# Patient Record
Sex: Female | Born: 2000
Health system: Southern US, Community
[De-identification: ages and names within clinical notes are randomized; demographics above are authoritative.]

## PROBLEM LIST (undated history)

## (undated) DIAGNOSIS — J45909 Unspecified asthma, uncomplicated: Secondary | ICD-10-CM

## (undated) DIAGNOSIS — J302 Other seasonal allergic rhinitis: Secondary | ICD-10-CM

---
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
## 2018-01-24 DIAGNOSIS — L810 Postinflammatory hyperpigmentation: Secondary | ICD-10-CM | POA: Diagnosis not present

## 2018-01-24 DIAGNOSIS — L309 Dermatitis, unspecified: Secondary | ICD-10-CM | POA: Diagnosis not present

## 2018-02-23 DIAGNOSIS — N946 Dysmenorrhea, unspecified: Secondary | ICD-10-CM | POA: Diagnosis not present

## 2018-03-21 ENCOUNTER — Emergency Department (HOSPITAL_COMMUNITY): Payer: BLUE CROSS/BLUE SHIELD

## 2018-03-21 ENCOUNTER — Other Ambulatory Visit: Payer: Self-pay

## 2018-03-21 ENCOUNTER — Encounter (HOSPITAL_COMMUNITY): Payer: Self-pay | Admitting: Emergency Medicine

## 2018-03-21 DIAGNOSIS — R0602 Shortness of breath: Secondary | ICD-10-CM | POA: Insufficient documentation

## 2018-03-21 DIAGNOSIS — Z79899 Other long term (current) drug therapy: Secondary | ICD-10-CM | POA: Diagnosis not present

## 2018-03-21 DIAGNOSIS — J45909 Unspecified asthma, uncomplicated: Secondary | ICD-10-CM | POA: Insufficient documentation

## 2018-03-21 DIAGNOSIS — R0789 Other chest pain: Secondary | ICD-10-CM | POA: Insufficient documentation

## 2018-03-21 NOTE — ED Triage Notes (Signed)
Pt presents with mother for complaint of shortness of breath that started around 5pm tonight and felt like chest is caving in. No acute distress noted at time of triage and no excessive work of breathing noted.

## 2018-03-22 ENCOUNTER — Emergency Department (HOSPITAL_COMMUNITY)
Admission: EM | Admit: 2018-03-22 | Discharge: 2018-03-22 | Disposition: A | Discharge status: Home / Self Care | Payer: BLUE CROSS/BLUE SHIELD | Attending: Emergency Medicine | Admitting: Emergency Medicine

## 2018-03-22 DIAGNOSIS — R0789 Other chest pain: Secondary | ICD-10-CM

## 2018-03-22 LAB — CBC WITH DIFFERENTIAL/PLATELET
Basophils Absolute: 0.1 10*3/uL (ref 0.0–0.1)
Basophils Relative: 1 %
Eosinophils Absolute: 0.3 10*3/uL (ref 0.0–0.7)
Eosinophils Relative: 5 %
HCT: 37.3 % (ref 36.0–46.0)
Hemoglobin: 12.3 g/dL (ref 12.0–15.0)
Lymphocytes Relative: 46 %
Lymphs Abs: 3 10*3/uL (ref 0.7–4.0)
MCH: 30.2 pg (ref 26.0–34.0)
MCHC: 33 g/dL (ref 30.0–36.0)
MCV: 91.6 fL (ref 78.0–100.0)
Monocytes Absolute: 0.7 10*3/uL (ref 0.1–1.0)
Monocytes Relative: 10 %
Neutro Abs: 2.5 10*3/uL (ref 1.7–7.7)
Neutrophils Relative %: 38 %
Platelets: 243 10*3/uL (ref 150–400)
RBC: 4.07 MIL/uL (ref 3.87–5.11)
RDW: 12.9 % (ref 11.5–15.5)
WBC: 6.6 10*3/uL (ref 4.0–10.5)

## 2018-03-22 LAB — BASIC METABOLIC PANEL
Anion gap: 6 (ref 5–15)
BUN: 13 mg/dL (ref 6–20)
CO2: 27 mmol/L (ref 22–32)
Calcium: 9.1 mg/dL (ref 8.9–10.3)
Chloride: 106 mmol/L (ref 101–111)
Creatinine, Ser: 0.68 mg/dL (ref 0.44–1.00)
GFR calc Af Amer: >60 mL/min (ref >60)
GFR calc non Af Amer: >60 mL/min (ref >60)
Glucose, Bld: 103 mg/dL — ABNORMAL HIGH (ref 65–99)
Potassium: 3 mmol/L — ABNORMAL LOW (ref 3.5–5.1)
Sodium: 139 mmol/L (ref 135–145)

## 2018-03-22 LAB — I-STAT BETA HCG BLOOD, ED (MC, WL, AP ONLY): I-stat hCG, quantitative: <5 m[IU]/mL (ref <5)

## 2018-03-22 LAB — D-DIMER, QUANTITATIVE: D-Dimer, Quant: <0.27 ug/mL-FEU (ref 0.00–0.50)

## 2018-03-22 MED ORDER — POTASSIUM CHLORIDE CRYS ER 20 MEQ PO TBCR: 20.0000 meq | EXTENDED_RELEASE_TABLET | Freq: Two times a day (BID) | ORAL | 0 refills | Status: AC

## 2018-03-22 MED ORDER — POTASSIUM CHLORIDE CRYS ER 20 MEQ PO TBCR
40.0000 meq | EXTENDED_RELEASE_TABLET | Freq: Once | ORAL | Status: AC
  Administered 2018-03-22: 40 meq via ORAL
  Filled 2018-03-22: qty 2

## 2018-03-22 MED ORDER — ALBUTEROL SULFATE (2.5 MG/3ML) 0.083% IN NEBU
2.5000 mg | INHALATION_SOLUTION | Freq: Once | RESPIRATORY_TRACT | Status: AC
  Administered 2018-03-22: 2.5 mg via RESPIRATORY_TRACT
  Filled 2018-03-22: qty 3

## 2018-03-22 MED ORDER — IBUPROFEN 200 MG PO TABS
400.0000 mg | ORAL_TABLET | Freq: Once | ORAL | Status: AC
  Administered 2018-03-22: 400 mg via ORAL
  Filled 2018-03-22: qty 2

## 2018-03-22 NOTE — ED Provider Notes (Signed)
[facility]-EMERGENCY DEPT Provider Note   CSN: [id number] Arrival date & time: 03/21/18  2204  Time seen 04:05 AM   History   Chief Complaint Chief Complaint  Patient presents with  . Shortness of Breath    HPI Leah Ortega is a 17 y.o. female.  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  She states today, April 1 she had laid down to take a nap and when she woke up at 5 PM she had some left upper chest pain and shortness of breath.  She describes the pain as pressure and tightness.  It hurts with movement.  Nothing makes it feel better.  She has had it before and when she used her inhaler it would get better.  She used her inhaler tonight and it helped for a little while but then it returns.  She states she was wheezing initially but not now.  She states she has had a mild cough in the ED without fever.  She had a sore throat last week but not now.  She denies rhinorrhea, sneezing, nausea, or vomiting.  She denies being on birth control pills or recent travel.  Family history there was a paternal great grandmother who had some type of clotting problem.  PCP Dr Shaune Pollackonna Gates  Past Medical History:  Diagnosis Date  . Asthma   . Seasonal allergies     There are no active problems to display for this patient.   History reviewed. No pertinent surgical history.   OB History   None      Home Medications    Prior to Admission medications   Medication Sig Start Date End Date Taking? Authorizing Provider  albuterol (PROVENTIL HFA;VENTOLIN HFA) 108 (90 BASE) MCG/ACT inhaler Inhale 1-2 puffs into the lungs every 6 (six) hours as needed for wheezing. 02/13/13  Yes Jimmie Mollyoll, Paolo, MD  levocetirizine (XYZAL) 5 MG tablet Take 5 mg by mouth every evening.   Yes [provider]  amoxicillin (AMOXIL) 500 MG tablet Take 1 tablet (500 mg total) by mouth 2 (two) times daily. Patient not  taking: Reported on 03/22/2018 04/24/13   McGill, Marga HootsJacquelyn A, MD  fluticasone (FLONASE) 50 MCG/ACT nasal spray Place 2 sprays into the nose daily. Patient not taking: Reported on 03/22/2018 04/24/13   McGill, Marga HootsJacquelyn A, MD  potassium chloride SA (K-DUR,KLOR-CON) 20 MEQ tablet Take 1 tablet (20 mEq total) by mouth 2 (two) times daily. 03/22/18   Devoria AlbeKnapp, Shahidah Nesbitt, MD    Family History Family History  Problem Relation Age of Onset  . Hyperlipidemia Mother   . Asthma Mother   . Diabetes Other   . Hypertension Other     Social History Social History   Tobacco Use  . Smoking status: Never Smoker  . Smokeless tobacco: Never Used  Substance Use Topics  . Alcohol use: No  . Drug use: No     Allergies   Patient has no known allergies.   Review of Systems Review of Systems  All other systems reviewed and are negative.    Physical Exam Updated Vital Signs BP 106/71 (BP Location: Left Arm)   Pulse (!) 51   Temp 98.4 F (36.9 C) (Oral)   Resp 17   Ht 5\' 2"  (1.575 m)   Wt 54.4 kg (120 lb)   LMP 03/14/2018   SpO2 99%   BMI 21.95 kg/m  Vital signs normal except for bradycardia   Physical Exam  Constitutional: She is oriented to person, place, and time. She appears well-developed and well-nourished.  Non-toxic appearance. She does not appear ill. No distress.  HENT:  Head: Normocephalic and atraumatic.  Right Ear: External ear normal.  Left Ear: External ear normal.  Nose: Nose normal. No mucosal edema or rhinorrhea.  Mouth/Throat: Oropharynx is clear and moist and mucous membranes are normal. No dental abscesses or uvula swelling.  Eyes: Pupils are equal, round, and reactive to light. Conjunctivae and EOM are normal.  Neck: Normal range of motion and full passive range of motion without pain. Neck supple.  Cardiovascular: Normal rate, regular rhythm and normal heart sounds. Exam reveals no gallop and no friction rub.  No murmur heard. Pulmonary/Chest: Effort normal and breath  sounds normal. No respiratory distress. She has no wheezes. She has no rhonchi. She has no rales. She exhibits no tenderness and no crepitus.  Area of chest pain noted, she was nontender to palpation.    Abdominal: Soft. Normal appearance and bowel sounds are normal. She exhibits no distension. There is no tenderness. There is no rebound and no guarding.  Musculoskeletal: Normal range of motion. She exhibits no edema or tenderness.  Moves all extremities well.   Neurological: She is alert and oriented to person, place, and time. She has normal strength. No cranial nerve deficit.  Skin: Skin is warm, dry and intact. No rash noted. No erythema. No pallor.  Psychiatric: She has a normal mood and affect. Her speech is normal and behavior is normal. Her mood appears not anxious.  Patient seem to have a lot of fluttering of her eyelids.  Nursing note and vitals reviewed.    ED Treatments / Results  Labs (all labs ordered are listed, but only abnormal results are displayed)  Results for orders placed or performed during the hospital encounter of 03/22/18  D-dimer, quantitative  Result Value Ref Range   D-Dimer, Quant <0.27 0.00 - 0.50 ug/mL-FEU  Basic metabolic panel  Result Value Ref Range   Sodium 139 135 - 145 mmol/L   Potassium 3.0 (L) 3.5 - 5.1 mmol/L   Chloride 106 101 - 111 mmol/L   CO2 27 22 - 32 mmol/L   Glucose, Bld 103 (H) 65 - 99 mg/dL   BUN 13 6 - 20 mg/dL   Creatinine, Ser 1.61 0.44 - 1.00 mg/dL   Calcium 9.1 8.9 - 09.6 mg/dL   GFR calc non Af Amer >60 >60 mL/min   GFR calc Af Amer >60 >60 mL/min   Anion gap 6 5 - 15  CBC with Differential  Result Value Ref Range   WBC 6.6 4.0 - 10.5 K/uL   RBC 4.07 3.87 - 5.11 MIL/uL   Hemoglobin 12.3 12.0 - 15.0 g/dL   HCT 04.5 40.9 - 81.1 %   MCV 91.6 78.0 - 100.0 fL   MCH 30.2 26.0 - 34.0 pg   MCHC 33.0 30.0 - 36.0 g/dL   RDW 91.4 78.2 - 95.6 %   Platelets 243 150 - 400 K/uL   Neutrophils Relative % 38 %   Neutro Abs 2.5 1.7  - 7.7 K/uL   Lymphocytes Relative 46 %   Lymphs Abs 3.0 0.7 - 4.0 K/uL   Monocytes Relative 10 %   Monocytes Absolute 0.7 0.1 - 1.0 K/uL   Eosinophils Relative 5 %   Eosinophils Absolute 0.3 0.0 - 0.7 K/uL   Basophils Relative 1 %  Basophils Absolute 0.1 0.0 - 0.1 K/uL  I-Stat beta hCG blood, ED  Result Value Ref Range   I-stat hCG, quantitative <5.0 <5 mIU/mL   Comment 3           Laboratory interpretation all normal except hypokalemia   EKG None  Radiology Dg Chest 2 View  Result Date: 03/21/2018 CLINICAL DATA:  Shortness of breath EXAM: CHEST - 2 VIEW COMPARISON:  None. FINDINGS: The heart size and mediastinal contours are within normal limits. Both lungs are clear. The visualized skeletal structures are unremarkable. IMPRESSION: No active cardiopulmonary disease. Electronically Signed   By: Jasmine Pang M.D.   On: 03/21/2018 23:18    Procedures Procedures (including critical care time)  Medications Ordered in ED Medications  ibuprofen (ADVIL,MOTRIN) tablet 400 mg (400 mg Oral Given 03/22/18 0440)  albuterol (PROVENTIL) (2.5 MG/3ML) 0.083% nebulizer solution 2.5 mg (2.5 mg Nebulization Given 03/22/18 0504)  potassium chloride SA (K-DUR,KLOR-CON) CR tablet 40 mEq (40 mEq Oral Given 03/22/18 0543)     Initial Impression / Assessment and Plan / ED Course  I have reviewed the triage vital signs and the nursing notes.  Pertinent labs & imaging results that were available during my care of the patient were reviewed by me and considered in my medical decision making (see chart for details).    Patient was given ibuprofen for her chest pain.  D-dimer was done to do a screen for blood clotting problems.  Patient was not wheezing and did not want a albuterol nebulizer at this time however she was advised to let nursing staff know she needed one.  4:50 AM nursing staff states patient is requesting a nebulizer treatment, she was given albuterol.  Recheck at time of discharge patient  is laughing with her mother.  She states her pain is much better.  We discussed her test results which were normal except for the low potassium.  Patient was given a dose in the ED and a prescription to take home.  At this time I think her pain is probably musculoskeletal most likely from the stress of her week in college last week where she had a lot of exams and projects she had to do.   Final Clinical Impressions(s) / ED Diagnoses   Final diagnoses:  Atypical chest pain    ED Discharge Orders        Ordered    potassium chloride SA (K-DUR,KLOR-CON) 20 MEQ tablet  2 times daily     03/22/18 0543    OTC ibuprofen   Plan discharge  Devoria Albe, MD, Concha Pyo, MD 03/22/18 [id number]

## 2018-03-22 NOTE — Discharge Instructions (Addendum)
Use ice and heat for comfort. Take ibuprofen 400 mg 4 times a day for pain. Use your inhaler as needed for wheezing or shortness of breath.  Recheck if you get a fever or struggle to breathe.

## 2018-05-11 DIAGNOSIS — R509 Fever, unspecified: Secondary | ICD-10-CM | POA: Diagnosis not present

## 2018-05-11 DIAGNOSIS — J029 Acute pharyngitis, unspecified: Secondary | ICD-10-CM | POA: Diagnosis not present

## 2018-05-11 DIAGNOSIS — B349 Viral infection, unspecified: Secondary | ICD-10-CM | POA: Diagnosis not present

## 2018-05-14 DIAGNOSIS — J039 Acute tonsillitis, unspecified: Secondary | ICD-10-CM | POA: Diagnosis not present

## 2018-06-11 DIAGNOSIS — J0100 Acute maxillary sinusitis, unspecified: Secondary | ICD-10-CM | POA: Diagnosis not present

## 2018-12-05 DIAGNOSIS — Z30013 Encounter for initial prescription of injectable contraceptive: Secondary | ICD-10-CM | POA: Diagnosis not present

## 2018-12-05 DIAGNOSIS — N946 Dysmenorrhea, unspecified: Secondary | ICD-10-CM | POA: Diagnosis not present

## 2019-02-22 DIAGNOSIS — Z3042 Encounter for surveillance of injectable contraceptive: Secondary | ICD-10-CM | POA: Diagnosis not present

## 2019-05-04 DIAGNOSIS — M25562 Pain in left knee: Secondary | ICD-10-CM | POA: Diagnosis not present

## 2019-05-04 DIAGNOSIS — M25362 Other instability, left knee: Secondary | ICD-10-CM | POA: Diagnosis not present

## 2019-05-11 DIAGNOSIS — M25562 Pain in left knee: Secondary | ICD-10-CM | POA: Diagnosis not present

## 2019-05-11 DIAGNOSIS — M25662 Stiffness of left knee, not elsewhere classified: Secondary | ICD-10-CM | POA: Diagnosis not present

## 2019-05-11 DIAGNOSIS — M25362 Other instability, left knee: Secondary | ICD-10-CM | POA: Diagnosis not present

## 2019-05-11 DIAGNOSIS — R2689 Other abnormalities of gait and mobility: Secondary | ICD-10-CM | POA: Diagnosis not present

## 2019-05-11 DIAGNOSIS — Z3042 Encounter for surveillance of injectable contraceptive: Secondary | ICD-10-CM | POA: Diagnosis not present

## 2019-05-18 DIAGNOSIS — M25562 Pain in left knee: Secondary | ICD-10-CM | POA: Diagnosis not present

## 2019-05-18 DIAGNOSIS — M25662 Stiffness of left knee, not elsewhere classified: Secondary | ICD-10-CM | POA: Diagnosis not present

## 2019-05-18 DIAGNOSIS — R2689 Other abnormalities of gait and mobility: Secondary | ICD-10-CM | POA: Diagnosis not present

## 2019-05-18 DIAGNOSIS — M25362 Other instability, left knee: Secondary | ICD-10-CM | POA: Diagnosis not present

## 2019-05-24 DIAGNOSIS — M25512 Pain in left shoulder: Secondary | ICD-10-CM | POA: Diagnosis not present

## 2019-05-24 DIAGNOSIS — M25511 Pain in right shoulder: Secondary | ICD-10-CM | POA: Diagnosis not present

## 2019-05-24 DIAGNOSIS — M5020 Other cervical disc displacement, unspecified cervical region: Secondary | ICD-10-CM | POA: Diagnosis not present

## 2019-05-24 DIAGNOSIS — M6281 Muscle weakness (generalized): Secondary | ICD-10-CM | POA: Diagnosis not present

## 2019-05-31 DIAGNOSIS — M25511 Pain in right shoulder: Secondary | ICD-10-CM | POA: Diagnosis not present

## 2019-05-31 DIAGNOSIS — M6281 Muscle weakness (generalized): Secondary | ICD-10-CM | POA: Diagnosis not present

## 2019-05-31 DIAGNOSIS — M25512 Pain in left shoulder: Secondary | ICD-10-CM | POA: Diagnosis not present

## 2019-05-31 DIAGNOSIS — M5020 Other cervical disc displacement, unspecified cervical region: Secondary | ICD-10-CM | POA: Diagnosis not present

## 2019-06-07 DIAGNOSIS — M25511 Pain in right shoulder: Secondary | ICD-10-CM | POA: Diagnosis not present

## 2019-06-07 DIAGNOSIS — M6281 Muscle weakness (generalized): Secondary | ICD-10-CM | POA: Diagnosis not present

## 2019-06-07 DIAGNOSIS — M25512 Pain in left shoulder: Secondary | ICD-10-CM | POA: Diagnosis not present

## 2019-06-07 DIAGNOSIS — M5020 Other cervical disc displacement, unspecified cervical region: Secondary | ICD-10-CM | POA: Diagnosis not present

## 2019-06-14 DIAGNOSIS — M5020 Other cervical disc displacement, unspecified cervical region: Secondary | ICD-10-CM | POA: Diagnosis not present

## 2019-06-14 DIAGNOSIS — M25511 Pain in right shoulder: Secondary | ICD-10-CM | POA: Diagnosis not present

## 2019-06-14 DIAGNOSIS — M25512 Pain in left shoulder: Secondary | ICD-10-CM | POA: Diagnosis not present

## 2019-06-14 DIAGNOSIS — M6281 Muscle weakness (generalized): Secondary | ICD-10-CM | POA: Diagnosis not present

## 2019-06-21 DIAGNOSIS — M6281 Muscle weakness (generalized): Secondary | ICD-10-CM | POA: Diagnosis not present

## 2019-06-21 DIAGNOSIS — R2689 Other abnormalities of gait and mobility: Secondary | ICD-10-CM | POA: Diagnosis not present

## 2019-06-21 DIAGNOSIS — R6889 Other general symptoms and signs: Secondary | ICD-10-CM | POA: Diagnosis not present

## 2019-06-21 DIAGNOSIS — M222X2 Patellofemoral disorders, left knee: Secondary | ICD-10-CM | POA: Diagnosis not present

## 2019-07-27 DIAGNOSIS — Z3042 Encounter for surveillance of injectable contraceptive: Secondary | ICD-10-CM | POA: Diagnosis not present

## 2019-10-12 DIAGNOSIS — Z3042 Encounter for surveillance of injectable contraceptive: Secondary | ICD-10-CM | POA: Diagnosis not present

## 2019-11-17 IMAGING — CR DG CHEST 2V
2 series · 2 of 2 positions shown · non-contrast
Comparison: None.

CLINICAL DATA: Shortness of breath

EXAM:
CHEST - 2 VIEW

[w chest pa]
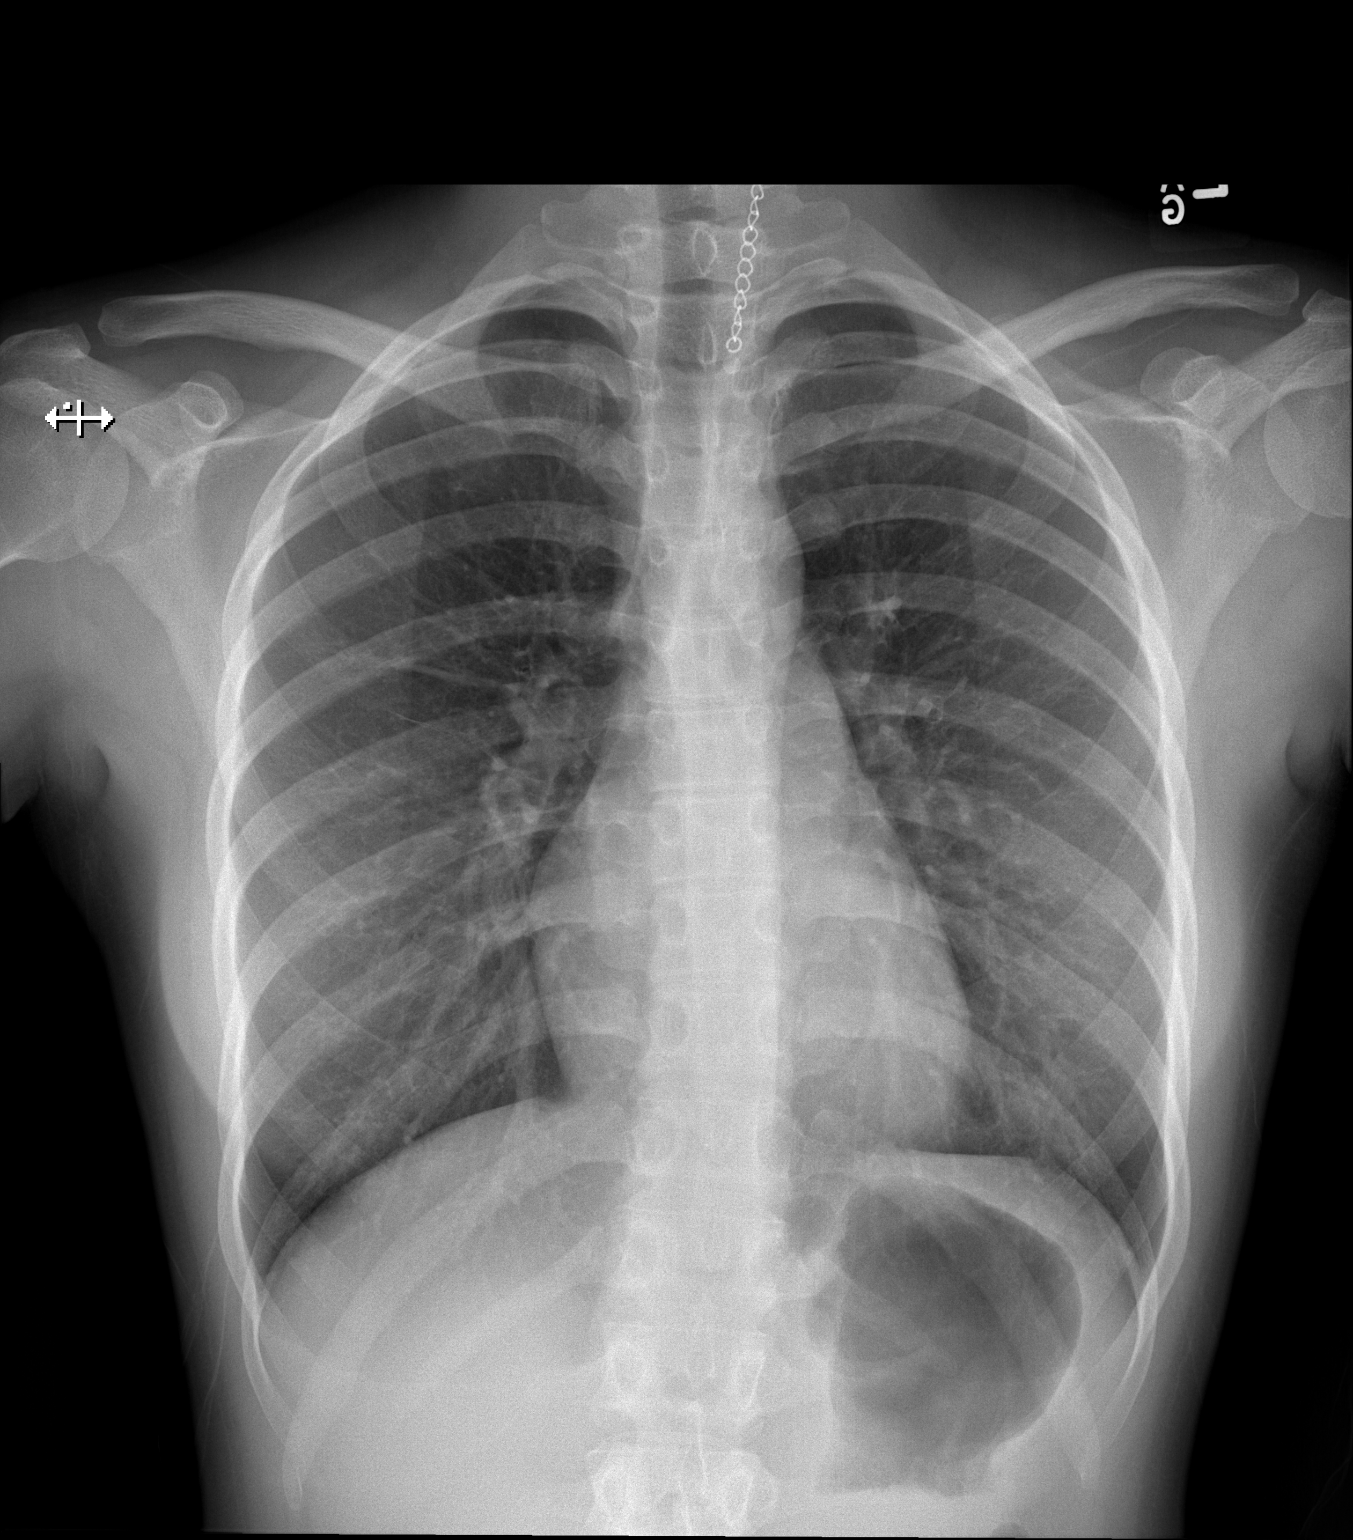

[w chest lat]
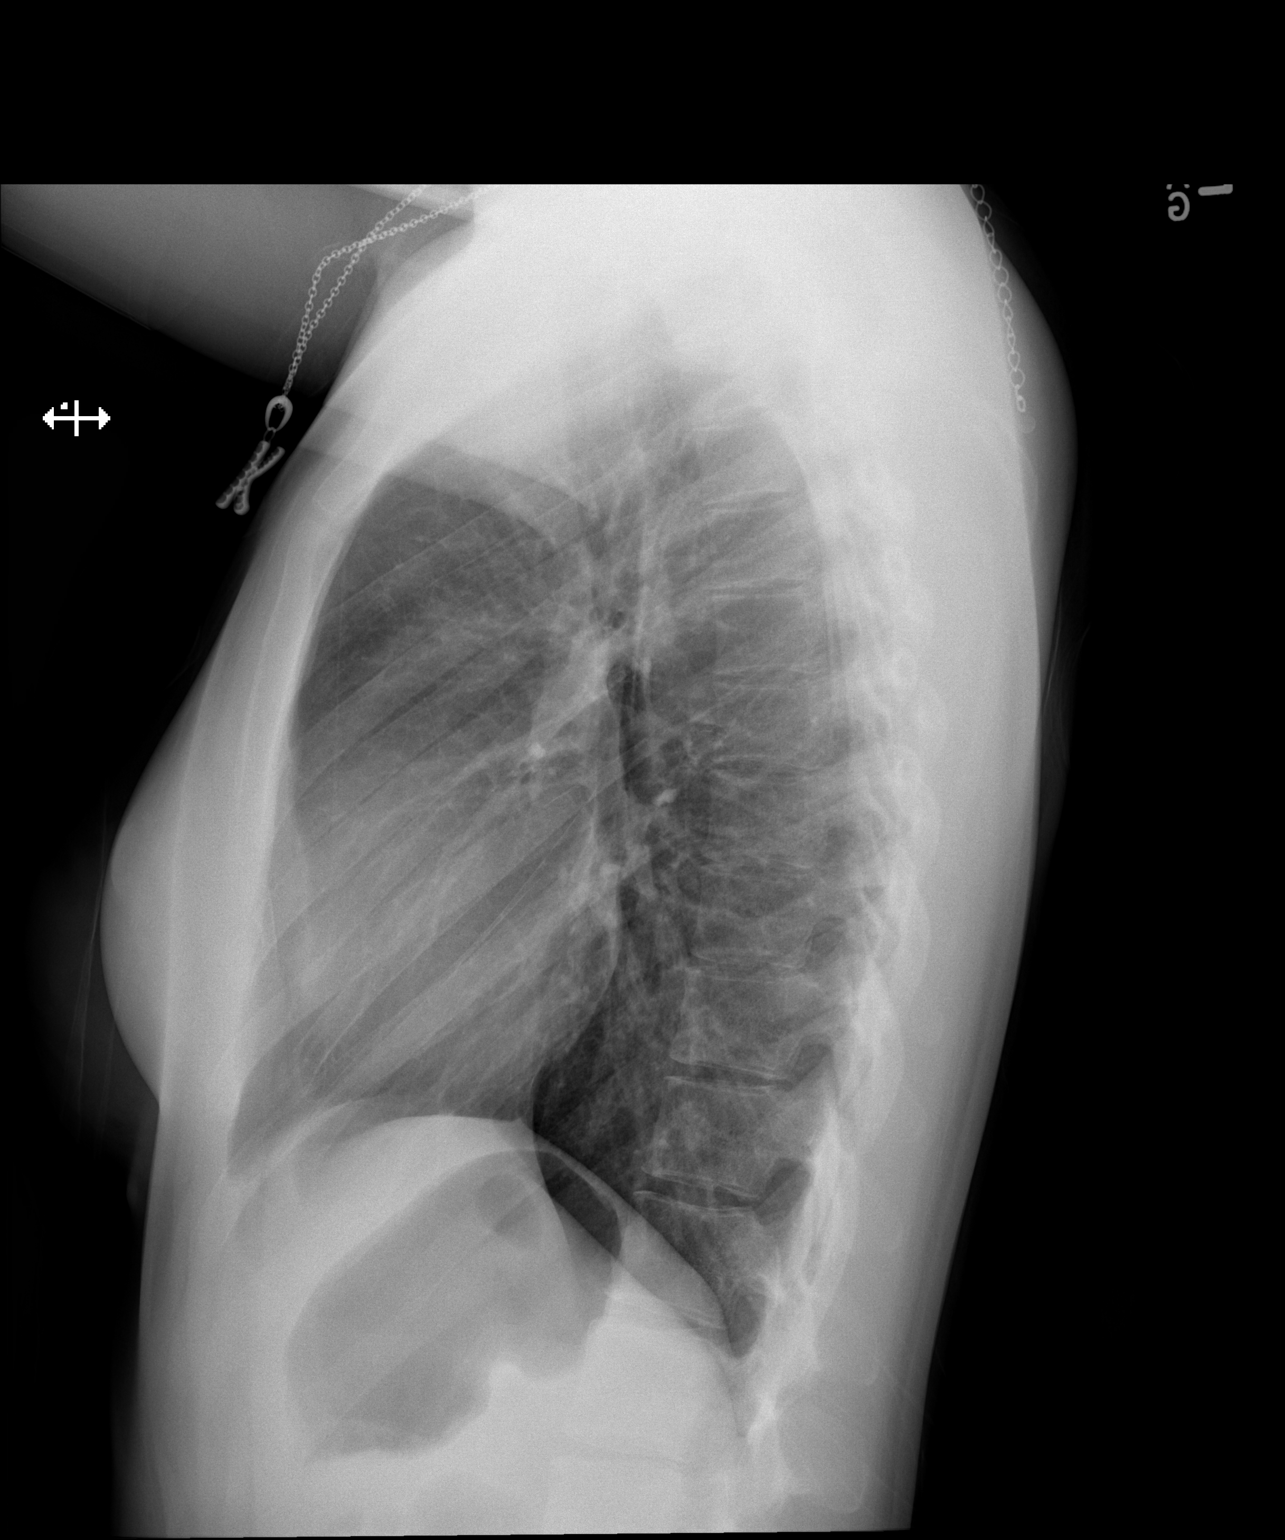

[2 of 2 positions shown; findings below may reference images not displayed]

FINDINGS: The heart size and mediastinal contours are within normal limits.
Both lungs are clear. The visualized skeletal structures are
unremarkable.
IMPRESSION: No active cardiopulmonary disease.

## 2019-12-07 DIAGNOSIS — Z3042 Encounter for surveillance of injectable contraceptive: Secondary | ICD-10-CM | POA: Diagnosis not present

## 2019-12-13 DIAGNOSIS — L309 Dermatitis, unspecified: Secondary | ICD-10-CM | POA: Diagnosis not present

## 2019-12-28 DIAGNOSIS — Z3042 Encounter for surveillance of injectable contraceptive: Secondary | ICD-10-CM | POA: Diagnosis not present

## 2020-01-31 DIAGNOSIS — L309 Dermatitis, unspecified: Secondary | ICD-10-CM | POA: Diagnosis not present

## 2020-03-14 DIAGNOSIS — Z3042 Encounter for surveillance of injectable contraceptive: Secondary | ICD-10-CM | POA: Diagnosis not present

## 2020-05-30 DIAGNOSIS — Z3042 Encounter for surveillance of injectable contraceptive: Secondary | ICD-10-CM | POA: Diagnosis not present

## 2020-08-08 DIAGNOSIS — Z3042 Encounter for surveillance of injectable contraceptive: Secondary | ICD-10-CM | POA: Diagnosis not present

## 2020-09-27 DIAGNOSIS — Z20822 Contact with and (suspected) exposure to covid-19: Secondary | ICD-10-CM | POA: Diagnosis not present

## 2020-10-24 DIAGNOSIS — Z3042 Encounter for surveillance of injectable contraceptive: Secondary | ICD-10-CM | POA: Diagnosis not present

## 2020-10-30 DIAGNOSIS — D509 Iron deficiency anemia, unspecified: Secondary | ICD-10-CM | POA: Diagnosis not present

## 2020-10-30 DIAGNOSIS — Z0189 Encounter for other specified special examinations: Secondary | ICD-10-CM | POA: Diagnosis not present

## 2020-10-30 DIAGNOSIS — E559 Vitamin D deficiency, unspecified: Secondary | ICD-10-CM | POA: Diagnosis not present

## 2021-01-13 DIAGNOSIS — F32A Depression, unspecified: Secondary | ICD-10-CM | POA: Diagnosis not present

## 2021-01-13 DIAGNOSIS — F419 Anxiety disorder, unspecified: Secondary | ICD-10-CM | POA: Diagnosis not present

## 2021-01-13 DIAGNOSIS — Z3042 Encounter for surveillance of injectable contraceptive: Secondary | ICD-10-CM | POA: Diagnosis not present

## 2021-04-01 DIAGNOSIS — F32A Depression, unspecified: Secondary | ICD-10-CM | POA: Diagnosis not present

## 2021-04-01 DIAGNOSIS — Z3042 Encounter for surveillance of injectable contraceptive: Secondary | ICD-10-CM | POA: Diagnosis not present

## 2021-04-01 DIAGNOSIS — F419 Anxiety disorder, unspecified: Secondary | ICD-10-CM | POA: Diagnosis not present

## 2021-04-01 DIAGNOSIS — Z01419 Encounter for gynecological examination (general) (routine) without abnormal findings: Secondary | ICD-10-CM | POA: Diagnosis not present

## 2021-04-01 DIAGNOSIS — Z124 Encounter for screening for malignant neoplasm of cervix: Secondary | ICD-10-CM | POA: Diagnosis not present

## 2021-04-01 DIAGNOSIS — Z113 Encounter for screening for infections with a predominantly sexual mode of transmission: Secondary | ICD-10-CM | POA: Diagnosis not present

## 2021-04-23 DIAGNOSIS — L810 Postinflammatory hyperpigmentation: Secondary | ICD-10-CM | POA: Diagnosis not present

## 2021-04-23 DIAGNOSIS — L74510 Primary focal hyperhidrosis, axilla: Secondary | ICD-10-CM | POA: Diagnosis not present

## 2021-06-17 DIAGNOSIS — N946 Dysmenorrhea, unspecified: Secondary | ICD-10-CM | POA: Diagnosis not present

## 2021-06-17 DIAGNOSIS — Z3042 Encounter for surveillance of injectable contraceptive: Secondary | ICD-10-CM | POA: Diagnosis not present

## 2021-06-25 ENCOUNTER — Ambulatory Visit: Payer: BLUE CROSS/BLUE SHIELD | Admitting: Dermatology

## 2021-07-09 DIAGNOSIS — L2084 Intrinsic (allergic) eczema: Secondary | ICD-10-CM | POA: Diagnosis not present

## 2021-08-13 DIAGNOSIS — Z79899 Other long term (current) drug therapy: Secondary | ICD-10-CM | POA: Diagnosis not present

## 2021-08-13 DIAGNOSIS — L2084 Intrinsic (allergic) eczema: Secondary | ICD-10-CM | POA: Diagnosis not present

## 2021-08-14 DIAGNOSIS — Z79899 Other long term (current) drug therapy: Secondary | ICD-10-CM | POA: Diagnosis not present

## 2021-11-19 DIAGNOSIS — Z3042 Encounter for surveillance of injectable contraceptive: Secondary | ICD-10-CM | POA: Diagnosis not present

## 2022-01-19 DIAGNOSIS — B309 Viral conjunctivitis, unspecified: Secondary | ICD-10-CM | POA: Diagnosis not present

## 2022-02-05 DIAGNOSIS — Z3042 Encounter for surveillance of injectable contraceptive: Secondary | ICD-10-CM | POA: Diagnosis not present

## 2022-04-21 DIAGNOSIS — Z6827 Body mass index (BMI) 27.0-27.9, adult: Secondary | ICD-10-CM | POA: Diagnosis not present

## 2022-04-21 DIAGNOSIS — R5383 Other fatigue: Secondary | ICD-10-CM | POA: Diagnosis not present

## 2022-04-21 DIAGNOSIS — Z01419 Encounter for gynecological examination (general) (routine) without abnormal findings: Secondary | ICD-10-CM | POA: Diagnosis not present

## 2022-04-21 DIAGNOSIS — Z3042 Encounter for surveillance of injectable contraceptive: Secondary | ICD-10-CM | POA: Diagnosis not present

## 2022-07-07 DIAGNOSIS — Z3042 Encounter for surveillance of injectable contraceptive: Secondary | ICD-10-CM | POA: Diagnosis not present

## 2022-09-15 DIAGNOSIS — N926 Irregular menstruation, unspecified: Secondary | ICD-10-CM | POA: Diagnosis not present

## 2022-09-15 DIAGNOSIS — Z3042 Encounter for surveillance of injectable contraceptive: Secondary | ICD-10-CM | POA: Diagnosis not present

## 2022-09-15 DIAGNOSIS — Z113 Encounter for screening for infections with a predominantly sexual mode of transmission: Secondary | ICD-10-CM | POA: Diagnosis not present

## 2022-10-28 DIAGNOSIS — J302 Other seasonal allergic rhinitis: Secondary | ICD-10-CM | POA: Diagnosis not present

## 2022-10-28 DIAGNOSIS — J3081 Allergic rhinitis due to animal (cat) (dog) hair and dander: Secondary | ICD-10-CM | POA: Diagnosis not present

## 2022-10-28 DIAGNOSIS — J301 Allergic rhinitis due to pollen: Secondary | ICD-10-CM | POA: Diagnosis not present

## 2022-10-28 DIAGNOSIS — J3089 Other allergic rhinitis: Secondary | ICD-10-CM | POA: Diagnosis not present

## 2022-11-02 DIAGNOSIS — J029 Acute pharyngitis, unspecified: Secondary | ICD-10-CM | POA: Diagnosis not present

## 2022-11-17 DIAGNOSIS — Z3042 Encounter for surveillance of injectable contraceptive: Secondary | ICD-10-CM | POA: Diagnosis not present

## 2022-11-18 DIAGNOSIS — L209 Atopic dermatitis, unspecified: Secondary | ICD-10-CM | POA: Diagnosis not present

## 2022-12-03 DIAGNOSIS — J3081 Allergic rhinitis due to animal (cat) (dog) hair and dander: Secondary | ICD-10-CM | POA: Diagnosis not present

## 2022-12-03 DIAGNOSIS — J3089 Other allergic rhinitis: Secondary | ICD-10-CM | POA: Diagnosis not present

## 2022-12-03 DIAGNOSIS — L209 Atopic dermatitis, unspecified: Secondary | ICD-10-CM | POA: Diagnosis not present

## 2022-12-03 DIAGNOSIS — J301 Allergic rhinitis due to pollen: Secondary | ICD-10-CM | POA: Diagnosis not present

## 2022-12-16 DIAGNOSIS — R5383 Other fatigue: Secondary | ICD-10-CM | POA: Diagnosis not present

## 2022-12-16 DIAGNOSIS — U071 COVID-19: Secondary | ICD-10-CM | POA: Diagnosis not present

## 2022-12-16 DIAGNOSIS — R52 Pain, unspecified: Secondary | ICD-10-CM | POA: Diagnosis not present

## 2022-12-16 DIAGNOSIS — R051 Acute cough: Secondary | ICD-10-CM | POA: Diagnosis not present

## 2023-01-06 DIAGNOSIS — L209 Atopic dermatitis, unspecified: Secondary | ICD-10-CM | POA: Diagnosis not present

## 2023-01-12 DIAGNOSIS — J3081 Allergic rhinitis due to animal (cat) (dog) hair and dander: Secondary | ICD-10-CM | POA: Diagnosis not present

## 2023-01-12 DIAGNOSIS — J3089 Other allergic rhinitis: Secondary | ICD-10-CM | POA: Diagnosis not present

## 2023-01-12 DIAGNOSIS — J301 Allergic rhinitis due to pollen: Secondary | ICD-10-CM | POA: Diagnosis not present

## 2023-01-20 DIAGNOSIS — J3089 Other allergic rhinitis: Secondary | ICD-10-CM | POA: Diagnosis not present

## 2023-01-20 DIAGNOSIS — J3081 Allergic rhinitis due to animal (cat) (dog) hair and dander: Secondary | ICD-10-CM | POA: Diagnosis not present

## 2023-01-20 DIAGNOSIS — J301 Allergic rhinitis due to pollen: Secondary | ICD-10-CM | POA: Diagnosis not present

## 2023-01-26 DIAGNOSIS — Z3042 Encounter for surveillance of injectable contraceptive: Secondary | ICD-10-CM | POA: Diagnosis not present

## 2023-01-28 DIAGNOSIS — J301 Allergic rhinitis due to pollen: Secondary | ICD-10-CM | POA: Diagnosis not present

## 2023-01-28 DIAGNOSIS — J3081 Allergic rhinitis due to animal (cat) (dog) hair and dander: Secondary | ICD-10-CM | POA: Diagnosis not present

## 2023-01-28 DIAGNOSIS — J3089 Other allergic rhinitis: Secondary | ICD-10-CM | POA: Diagnosis not present

## 2023-02-04 DIAGNOSIS — J301 Allergic rhinitis due to pollen: Secondary | ICD-10-CM | POA: Diagnosis not present

## 2023-02-04 DIAGNOSIS — J3081 Allergic rhinitis due to animal (cat) (dog) hair and dander: Secondary | ICD-10-CM | POA: Diagnosis not present

## 2023-02-04 DIAGNOSIS — J3089 Other allergic rhinitis: Secondary | ICD-10-CM | POA: Diagnosis not present

## 2023-02-11 DIAGNOSIS — J3089 Other allergic rhinitis: Secondary | ICD-10-CM | POA: Diagnosis not present

## 2023-02-11 DIAGNOSIS — J301 Allergic rhinitis due to pollen: Secondary | ICD-10-CM | POA: Diagnosis not present

## 2023-02-11 DIAGNOSIS — J3081 Allergic rhinitis due to animal (cat) (dog) hair and dander: Secondary | ICD-10-CM | POA: Diagnosis not present

## 2023-02-18 DIAGNOSIS — J3089 Other allergic rhinitis: Secondary | ICD-10-CM | POA: Diagnosis not present

## 2023-02-18 DIAGNOSIS — J3081 Allergic rhinitis due to animal (cat) (dog) hair and dander: Secondary | ICD-10-CM | POA: Diagnosis not present

## 2023-02-18 DIAGNOSIS — J301 Allergic rhinitis due to pollen: Secondary | ICD-10-CM | POA: Diagnosis not present

## 2023-02-24 DIAGNOSIS — J3089 Other allergic rhinitis: Secondary | ICD-10-CM | POA: Diagnosis not present

## 2023-02-24 DIAGNOSIS — J3081 Allergic rhinitis due to animal (cat) (dog) hair and dander: Secondary | ICD-10-CM | POA: Diagnosis not present

## 2023-02-24 DIAGNOSIS — J301 Allergic rhinitis due to pollen: Secondary | ICD-10-CM | POA: Diagnosis not present

## 2023-03-04 DIAGNOSIS — J301 Allergic rhinitis due to pollen: Secondary | ICD-10-CM | POA: Diagnosis not present

## 2023-03-04 DIAGNOSIS — J3089 Other allergic rhinitis: Secondary | ICD-10-CM | POA: Diagnosis not present

## 2023-03-04 DIAGNOSIS — J3081 Allergic rhinitis due to animal (cat) (dog) hair and dander: Secondary | ICD-10-CM | POA: Diagnosis not present

## 2023-03-11 DIAGNOSIS — J3089 Other allergic rhinitis: Secondary | ICD-10-CM | POA: Diagnosis not present

## 2023-03-11 DIAGNOSIS — J301 Allergic rhinitis due to pollen: Secondary | ICD-10-CM | POA: Diagnosis not present

## 2023-03-11 DIAGNOSIS — J3081 Allergic rhinitis due to animal (cat) (dog) hair and dander: Secondary | ICD-10-CM | POA: Diagnosis not present

## 2023-03-22 DIAGNOSIS — R140 Abdominal distension (gaseous): Secondary | ICD-10-CM | POA: Diagnosis not present

## 2023-03-24 DIAGNOSIS — J4520 Mild intermittent asthma, uncomplicated: Secondary | ICD-10-CM | POA: Diagnosis not present

## 2023-03-24 DIAGNOSIS — J301 Allergic rhinitis due to pollen: Secondary | ICD-10-CM | POA: Diagnosis not present

## 2023-03-24 DIAGNOSIS — J3089 Other allergic rhinitis: Secondary | ICD-10-CM | POA: Diagnosis not present

## 2023-03-24 DIAGNOSIS — L209 Atopic dermatitis, unspecified: Secondary | ICD-10-CM | POA: Diagnosis not present

## 2023-03-24 DIAGNOSIS — J3081 Allergic rhinitis due to animal (cat) (dog) hair and dander: Secondary | ICD-10-CM | POA: Diagnosis not present

## 2023-04-06 DIAGNOSIS — Z3042 Encounter for surveillance of injectable contraceptive: Secondary | ICD-10-CM | POA: Diagnosis not present

## 2023-04-07 DIAGNOSIS — K5900 Constipation, unspecified: Secondary | ICD-10-CM | POA: Diagnosis not present

## 2023-04-07 DIAGNOSIS — R140 Abdominal distension (gaseous): Secondary | ICD-10-CM | POA: Diagnosis not present

## 2023-04-08 DIAGNOSIS — R140 Abdominal distension (gaseous): Secondary | ICD-10-CM | POA: Diagnosis not present

## 2023-07-15 DIAGNOSIS — R220 Localized swelling, mass and lump, head: Secondary | ICD-10-CM | POA: Diagnosis not present

## 2023-07-19 ENCOUNTER — Other Ambulatory Visit: Payer: Self-pay | Admitting: Family Medicine

## 2023-07-19 DIAGNOSIS — R220 Localized swelling, mass and lump, head: Secondary | ICD-10-CM

## 2023-07-23 ENCOUNTER — Ambulatory Visit
Admission: RE | Admit: 2023-07-23 | Discharge: 2023-07-23 | Disposition: A | Discharge status: Home / Self Care | Payer: BLUE CROSS/BLUE SHIELD | Source: Ambulatory Visit | Attending: Family Medicine | Admitting: Family Medicine

## 2023-07-23 DIAGNOSIS — R220 Localized swelling, mass and lump, head: Secondary | ICD-10-CM

## 2023-08-03 DIAGNOSIS — R140 Abdominal distension (gaseous): Secondary | ICD-10-CM | POA: Diagnosis not present

## 2023-08-04 ENCOUNTER — Other Ambulatory Visit (HOSPITAL_COMMUNITY): Payer: Self-pay | Admitting: Family Medicine

## 2023-08-04 DIAGNOSIS — R220 Localized swelling, mass and lump, head: Secondary | ICD-10-CM

## 2023-09-09 DIAGNOSIS — N946 Dysmenorrhea, unspecified: Secondary | ICD-10-CM | POA: Diagnosis not present

## 2023-09-09 DIAGNOSIS — N926 Irregular menstruation, unspecified: Secondary | ICD-10-CM | POA: Diagnosis not present

## 2023-09-09 DIAGNOSIS — Z1330 Encounter for screening examination for mental health and behavioral disorders, unspecified: Secondary | ICD-10-CM | POA: Diagnosis not present

## 2023-09-09 DIAGNOSIS — N920 Excessive and frequent menstruation with regular cycle: Secondary | ICD-10-CM | POA: Diagnosis not present

## 2023-10-07 DIAGNOSIS — Z309 Encounter for contraceptive management, unspecified: Secondary | ICD-10-CM | POA: Diagnosis not present

## 2023-10-18 DIAGNOSIS — N920 Excessive and frequent menstruation with regular cycle: Secondary | ICD-10-CM | POA: Diagnosis not present

## 2023-10-18 DIAGNOSIS — Z3042 Encounter for surveillance of injectable contraceptive: Secondary | ICD-10-CM | POA: Diagnosis not present

## 2024-01-04 DIAGNOSIS — N920 Excessive and frequent menstruation with regular cycle: Secondary | ICD-10-CM | POA: Diagnosis not present

## 2024-01-04 DIAGNOSIS — Z3042 Encounter for surveillance of injectable contraceptive: Secondary | ICD-10-CM | POA: Diagnosis not present

## 2024-01-24 DIAGNOSIS — Z0189 Encounter for other specified special examinations: Secondary | ICD-10-CM | POA: Diagnosis not present

## 2024-01-24 DIAGNOSIS — L819 Disorder of pigmentation, unspecified: Secondary | ICD-10-CM | POA: Diagnosis not present

## 2024-01-24 DIAGNOSIS — R5383 Other fatigue: Secondary | ICD-10-CM | POA: Diagnosis not present

## 2024-01-24 DIAGNOSIS — J4540 Moderate persistent asthma, uncomplicated: Secondary | ICD-10-CM | POA: Diagnosis not present

## 2024-01-24 DIAGNOSIS — E559 Vitamin D deficiency, unspecified: Secondary | ICD-10-CM | POA: Diagnosis not present

## 2024-01-24 DIAGNOSIS — D509 Iron deficiency anemia, unspecified: Secondary | ICD-10-CM | POA: Diagnosis not present

## 2024-01-27 DIAGNOSIS — L209 Atopic dermatitis, unspecified: Secondary | ICD-10-CM | POA: Diagnosis not present

## 2024-02-14 DIAGNOSIS — L209 Atopic dermatitis, unspecified: Secondary | ICD-10-CM | POA: Diagnosis not present

## 2024-03-01 DIAGNOSIS — L209 Atopic dermatitis, unspecified: Secondary | ICD-10-CM | POA: Diagnosis not present

## 2024-03-21 DIAGNOSIS — N920 Excessive and frequent menstruation with regular cycle: Secondary | ICD-10-CM | POA: Diagnosis not present

## 2024-03-21 DIAGNOSIS — Z3042 Encounter for surveillance of injectable contraceptive: Secondary | ICD-10-CM | POA: Diagnosis not present

## 2024-03-22 DIAGNOSIS — L308 Other specified dermatitis: Secondary | ICD-10-CM | POA: Diagnosis not present

## 2024-03-22 DIAGNOSIS — L853 Xerosis cutis: Secondary | ICD-10-CM | POA: Diagnosis not present

## 2024-03-22 DIAGNOSIS — L309 Dermatitis, unspecified: Secondary | ICD-10-CM | POA: Diagnosis not present

## 2024-04-03 DIAGNOSIS — L2089 Other atopic dermatitis: Secondary | ICD-10-CM | POA: Diagnosis not present

## 2024-04-12 DIAGNOSIS — Z113 Encounter for screening for infections with a predominantly sexual mode of transmission: Secondary | ICD-10-CM | POA: Diagnosis not present

## 2024-04-12 DIAGNOSIS — R350 Frequency of micturition: Secondary | ICD-10-CM | POA: Diagnosis not present

## 2024-04-12 DIAGNOSIS — Z124 Encounter for screening for malignant neoplasm of cervix: Secondary | ICD-10-CM | POA: Diagnosis not present

## 2024-04-12 DIAGNOSIS — N946 Dysmenorrhea, unspecified: Secondary | ICD-10-CM | POA: Diagnosis not present

## 2024-06-07 DIAGNOSIS — J029 Acute pharyngitis, unspecified: Secondary | ICD-10-CM | POA: Diagnosis not present

## 2024-06-14 DIAGNOSIS — G8929 Other chronic pain: Secondary | ICD-10-CM | POA: Diagnosis not present

## 2024-06-14 DIAGNOSIS — R102 Pelvic and perineal pain: Secondary | ICD-10-CM | POA: Diagnosis not present

## 2024-06-14 DIAGNOSIS — N760 Acute vaginitis: Secondary | ICD-10-CM | POA: Diagnosis not present

## 2024-07-01 DIAGNOSIS — J029 Acute pharyngitis, unspecified: Secondary | ICD-10-CM | POA: Diagnosis not present

## 2024-07-01 DIAGNOSIS — R52 Pain, unspecified: Secondary | ICD-10-CM | POA: Diagnosis not present

## 2024-07-01 DIAGNOSIS — R5383 Other fatigue: Secondary | ICD-10-CM | POA: Diagnosis not present

## 2024-08-07 DIAGNOSIS — G8929 Other chronic pain: Secondary | ICD-10-CM | POA: Diagnosis not present

## 2024-08-07 DIAGNOSIS — R102 Pelvic and perineal pain: Secondary | ICD-10-CM | POA: Diagnosis not present

## 2024-08-07 DIAGNOSIS — N809 Endometriosis, unspecified: Secondary | ICD-10-CM | POA: Diagnosis not present

## 2024-09-14 ENCOUNTER — Ambulatory Visit: Payer: BLUE CROSS/BLUE SHIELD | Admitting: Dermatology

## 2024-10-02 DIAGNOSIS — R102 Pelvic and perineal pain unspecified side: Secondary | ICD-10-CM | POA: Diagnosis not present

## 2024-10-02 DIAGNOSIS — N809 Endometriosis, unspecified: Secondary | ICD-10-CM | POA: Diagnosis not present

## 2024-10-04 ENCOUNTER — Other Ambulatory Visit (HOSPITAL_BASED_OUTPATIENT_CLINIC_OR_DEPARTMENT_OTHER): Payer: Self-pay | Admitting: Obstetrics and Gynecology

## 2024-10-04 DIAGNOSIS — N809 Endometriosis, unspecified: Secondary | ICD-10-CM

## 2024-11-22 DIAGNOSIS — N809 Endometriosis, unspecified: Secondary | ICD-10-CM | POA: Diagnosis not present
